# Patient Record
Sex: Male | Born: 2000 | Race: White | Hispanic: No
Health system: Northeastern US, Academic
[De-identification: ages and names within clinical notes are randomized; demographics above are authoritative.]

## PROBLEM LIST (undated history)

## (undated) DIAGNOSIS — R569 Unspecified convulsions: Secondary | ICD-10-CM

## (undated) DIAGNOSIS — L309 Dermatitis, unspecified: Secondary | ICD-10-CM

## (undated) DIAGNOSIS — Z889 Allergy status to unspecified drugs, medicaments and biological substances status: Secondary | ICD-10-CM

## (undated) HISTORY — PX: TYMPANOSTOMY TUBE PLACEMENT: SHX32

## (undated) HISTORY — PX: TONSILLECTOMY: SUR1361

## (undated) HISTORY — PX: ADENOIDECTOMY: SHX5191

## (undated) HISTORY — PX: MYRINGOTOMY: SUR874

## (undated) HISTORY — PX: CIRCUMCISION: SUR203

## (undated) HISTORY — PX: ADENOIDECTOMY: SUR15

---
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
## 2019-04-07 ENCOUNTER — Ambulatory Visit: Payer: Self-pay | Attending: Internal Medicine

## 2019-04-07 DIAGNOSIS — Z23 Encounter for immunization: Secondary | ICD-10-CM | POA: Insufficient documentation

## 2019-04-07 NOTE — Progress Notes (Signed)
   Covid-19 Vaccination Clinic  Name:  Peter Gordon    MRN: [id number] DOB: 2000-09-12  04/07/2019  Mr. Peter Gordon was observed post Covid-19 immunization for 15 minutes without incident. He was provided with Vaccine Information Sheet and instruction to access the V-Safe system.   Mr. Peter Gordon was instructed to call 911 with any severe reactions post vaccine: Marland Kitchen Difficulty breathing  . Swelling of face and throat  . A fast heartbeat  . A bad rash all over body  . Dizziness and weakness   Immunizations Administered    Name Date Dose VIS Date Route   Pfizer COVID-19 Vaccine 04/07/2019 11:59 AM 0.3 mL 01/12/2019 Intramuscular   Manufacturer: [employer], [employer]   Lot: [id number]   NDC: [address]-0

## 2019-04-28 ENCOUNTER — Ambulatory Visit: Payer: Self-pay | Attending: Internal Medicine

## 2019-04-28 DIAGNOSIS — Z23 Encounter for immunization: Secondary | ICD-10-CM

## 2019-04-28 NOTE — Progress Notes (Signed)
   Covid-19 Vaccination Clinic  Name:  Peter Gordon    MRN: [id number] DOB: [birth year]  04/28/2019  [name] was observed post Covid-19 immunization for 15 minutes without incident. He was provided with Vaccine Information Sheet and instruction to access the V-Safe system.   [name] was instructed to call 911 with any severe reactions post vaccine: Marland Kitchen Difficulty breathing  . Swelling of face and throat  . A fast heartbeat  . A bad rash all over body  . Dizziness and weakness   Immunizations Administered    Name Date Dose VIS Date Route   Pfizer COVID-19 Vaccine 04/28/2019 11:57 AM 0.3 mL 01/12/2019 Intramuscular   Manufacturer: [employer], [employer]   Lot: [id number]   NDC: [address]-8

## 2019-11-23 ENCOUNTER — Ambulatory Visit: Admitting: Sports Medicine

## 2019-11-23 ENCOUNTER — Ambulatory Visit: Admitting: Physician Assistant

## 2019-11-23 NOTE — Progress Notes (Signed)
* * Adam Foster **DOB:** 2000/04/26 (19 yo M) **Acc No.** [id number]  **DOS:** 11/23/2019    ---        Adam Foster    ------    71 Y old Male, DOB: 2000-04-29, External MRN: [id number]    Account Number: [id number]    [address], [place], [phone]    Home: [phone]    Insurance: [employer] PPO Payer ID: PAPER    PCP: Rhoderick Moody, MD Referring: Rhoderick Moody, MD External Visit ID:  [id number]    [facility]        * * *    11/23/2019    **Appointment Provider:** Aavya Shafer WHITE, PA **CHN#:** [phone]    ------      **Supervising Provider:** Rhoderick Moody    ---        **Current Medications**    ---    Taking      * FLUoxetine HCl 10 MG Capsule 1 capsule Orally Once a day     ---    Medication List reviewed and reconciled with the patient    ---      Past Medical History    ---      Anxiety/Depression.        ---      **Surgical History**    ---      No Surgical History documented.    ---      **Family History**    ---      : unknown    ---    Mother Has Hx of Stroke.    ---      **Social History**    ---    Tobacco  history: Never smoked.    Marital Status  Single.    Work/Occupation: full-time Consulting civil engineer.    Alcohol  Yes regularly.      **Hospitalization/Major Diagnostic Procedure**    ---      No Hospitalization History.    ---      **Review of Systems**    ---    _ORT_ :    Eyes  No  . Ear, Nose Throat  No  . Digestion, Stomach, Bowel  No  . Bladder  Problems  No  . Bleeding Problems  No  . Numbness/Tingling  No  .  Anxiety/Depression  No  . Fever/Chills/Fatigue  No  . Chest  Pain/Tightness/Palpitations  No  . Skin Rash  No  . Dental Problems  No  .  Joint/Muscle Pain/Cramps  Yes  . Blackout/Fainting  No  . Other  No  .            **Reason for Appointment**    ---      1\. Left knee pain DOI 11/19/2019    ---      **History of Present Illness**    ---    _COVID SCREEN_ :    19 year old male is being seen for the  first time. Presents with 4 days of  left knee pain after soccer injury DOI 11-19-19. Another player hit into him  and he fell directly onto the left knee. Plays for the [employer] soccer team. He  was able to continue playing the game and the next 2 practices. Reports  immediate swelling. Suffered an abrasion at the left anterior lateral aspect  of the knee. Pain is somewhat diffuse at the anterior knee. Feels a catching  sensation. Limping when he walks. He is taking Tylenol  and ibuprofen.  Elevating the knee and applying ice as needed.      **Vital Signs**    ---    Pain scale 6, Ht-in 72, Wt-lbs 193, BMI  **26.17** , BSA **2.11** , Ht-cm  **182.88** , Wt-kg **87.55** .      **Physical Examination**    ---    On examination Adam Foster is a pleasant-well appearing male in no apparent distress.  Gait is antalgic. Examination of the left knee reveals skin is clean and dry.  There is a large feeling abrasion at the anterior lateral aspect of the knee  joint. There is moderate effusion. There is no tenderness over the medial  greater than lateral joint line. PROM 5-90. AROM 0-120. Pain at the medial  aspect of the knee with full extension and deep flexion. Quad and patellar  tendon are nontender and without palpable gap. Able to straight leg raise  without extensor lag. Laxity with valgus stress at 30 degrees. There is severe  pain with valgus stress. Knee is stable to varus stress test. Stable  Lachman's. Negative anterior and posterior drawer. Calf is soft and nontender.  Fires EHL/FHL/GS/TA. Neurologic sensation intact to light touch in S/S/DP/SP/T  nerve distributions. Lower extremities are warm and well perfused with  palpable DP and PT pulses.    Radiographs: X-rays of the left knee were obtained and reviewed by [place] today.  No acute fracture or dislocation.      **Assessments**    ---    1\. Acute pain of left knee - M25.562 (Primary)    ---    2\. Tear of medial collateral ligament of left knee, initial encounter  -  Q00.867Y    ---      **Treatment**    ---      **1\. Acute pain of left knee**    Notes: Patient was seen and evaluated with Dr. Leandro Reasoner. All findings were  reviewed with the patient today including x-rays. Patient suffered a knee  injury playing soccer 4 days ago. He has an antalgic gait with a moderate  effusion, and medial greater than lateral joint line pain. He has laxity with  valgus stress consistent with MCL tear. Given his examination in combination  with the mechanical symptoms we would like to obtain an MRI to further  evaluate. Would like to rule out an unstable meniscus tear or other  ligamentous injury. he was provided with a Bledsoe brace today to wear  unlocked. He should wear the brace overnight. He may progress range of motion  as tolerated. Follow-up with the MRI results. Answered all questions and  patient agrees with plan.    ---      **Follow Up**    ---    after MRI    **Appointment Provider:** Osa Craver, PA    Electronically signed by Chauncey Cruel , MD on 11/26/2019 at 01:29 PM EDT    Sign off status: Completed        * * *        [facility]    [address]    [place], [place] [address]    Tel: [phone]    Fax: [phone]              * * *          Progress Note: Adam Rohman WHITE, PA 11/23/2019    ---    Note generated by eClinicalWorks EMR/PM Software ([email or web address])

## 2019-11-23 NOTE — Progress Notes (Signed)
 .  Progress Notes  .  Patient: Adam Foster  Provider: Osa Craver    .DOB: 10/21/2000 Age: 19 Y Sex: Male  Supervising Provider:: Rhoderick Moody  Date: 11/23/2019  .  PCP: Rhoderick Moody MD  Date: 11/23/2019  .  --------------------------------------------------------------------------------  .  REASON FOR APPOINTMENT  .  1. Left knee pain DOI 11/19/2019  .  HISTORY OF PRESENT ILLNESS  .  COVID SCREEN:   19 year old male is being seen for the first time. Presents with  4 days of left knee pain after soccer injury DOI 11-19-19.  Another player hit into him and he fell directly onto the left  knee. Plays for the [employer] soccer team. He was able to continue  playing the game and the next 2 practices. Reports immediate  swelling. Suffered an abrasion at the left anterior lateral  aspect of the knee. Pain is somewhat diffuse at the anterior  knee. Feels a catching sensation. Limping when he walks. He is  taking Tylenol and ibuprofen. Elevating the knee and applying ice  as needed.  .  CURRENT MEDICATIONS  .  Taking FLUoxetine HCl 10 MG Capsule 1 capsule Orally Once a day  Medication List reviewed and reconciled with the patient  .  PAST MEDICAL HISTORY  .  Anxiety/Depression  .  ALLERGIES  .  yes[Allergies Verified]  .  SURGICAL HISTORY  .  No Surgical History documented.  Marland Kitchen  FAMILY HISTORY  .  : unknown  Mother Has Hx of Stroke.  .  SOCIAL HISTORY  .  .  Tobaccohistory:Never smoked.  .  Marital Status Single.  .  Work/Occupation: full-time Consulting civil engineer.  .  Alcohol Yes regularly.  Marland Kitchen  HOSPITALIZATION/MAJOR DIAGNOSTIC PROCEDURE  .  No Hospitalization History.  Marland Kitchen  REVIEW OF SYSTEMS  .  ORT:  .  Eyes    No . Ear, Nose Throat    No . Digestion, Stomach, Bowel     No . Bladder Problems    No . Bleeding Problems    No .  Numbness/Tingling    No . Anxiety/Depression    No .  Fever/Chills/Fatigue    No . Chest Pain/Tightness/Palpitations     No . Skin Rash    No . Dental Problems    No . Joint/Muscle  Pain/Cramps     Yes . Blackout/Fainting    No . Other    No .  .  VITAL SIGNS  .  Pain scale 6, Ht-in 72, Wt-lbs 193, BMI 26.17, BSA 2.11, Ht-cm  182.88, Wt-kg 87.55.  Marland Kitchen  PHYSICAL EXAMINATION  .  On examination [name] is a pleasant-well appearing male in no  apparent distress. Gait is antalgic. Examination of the left knee  reveals skin is clean and dry. There is a large feeling abrasion  at the anterior lateral aspect of the knee joint. There is  moderate effusion. There is no tenderness over the medial greater  than lateral joint line. PROM 5-90. AROM 0-120. Pain at the  medial aspect of the knee with full extension and deep flexion.  Quad and patellar tendon are nontender and without palpable gap.  Able to straight leg raise without extensor lag. Laxity with  valgus stress at 30 degrees. There is severe pain with valgus  stress. Knee is stable to varus stress test. Stable Lachman's.  Negative anterior and posterior drawer. Calf is soft and  nontender. Fires EHL/FHL/GS/TA. Neurologic sensation intact to  light touch in S/S/DP/SP/T nerve distributions. Lower  extremities  are warm and well perfused with palpable DP and PT pulses.  Radiographs: X-rays of the left knee were obtained and reviewed  by [place] today. No acute fracture or dislocation.  .  ASSESSMENTS  .  Acute pain of left knee - M25.562 (Primary)  .  Tear of medial collateral ligament of left knee, initial  encounter - S92.909M  .  TREATMENT  .  Acute pain of left knee  Notes: Patient was seen and evaluated with Dr. Leandro Reasoner. All  findings were reviewed with the patient today including x-rays.  Patient suffered a knee injury playing soccer 4 days ago. He has  an antalgic gait with a moderate effusion, and medial greater  than lateral joint line pain. He has laxity with valgus stress  consistent with MCL tear. Given his examination in combination  with the mechanical symptoms we would like to obtain an MRI to  further evaluate. Would like to rule out an unstable  meniscus  tear or other ligamentous injury. he was provided with a Bledsoe  brace today to wear unlocked. He should wear the brace overnight.  He may progress range of motion as tolerated. Follow-up with the  MRI results. Answered all questions and patient agrees with plan.  .  FOLLOW UP  .  after MRI  .  Marland Kitchen  Appointment Provider: Osa Craver, PA  .  Electronically signed by Chauncey Cruel , MD on  11/26/2019 at 01:29 PM EDT  .  CONFIRMATORY SIGN OFF  SALZLER,MATTHEW J 11/26/2019 1:28:01 PM > I personally interviewed and examined the patient and both the PA and I contributed to this electronic note. I agree with the history, exam, assessment and plan as detailed in this note and edited it as necessary.  .  Document electronically signed by WHITE, COURTNEY    .

## 2019-11-25 ENCOUNTER — Ambulatory Visit: Admitting: Sports Medicine

## 2019-11-27 ENCOUNTER — Ambulatory Visit: Admit: 2019-11-27 | Payer: 59

## 2019-11-27 ENCOUNTER — Ambulatory Visit: Admitting: Sports Medicine

## 2019-11-27 ENCOUNTER — Ambulatory Visit

## 2019-11-27 ENCOUNTER — Ambulatory Visit (HOSPITAL_BASED_OUTPATIENT_CLINIC_OR_DEPARTMENT_OTHER): Admitting: Psychiatry

## 2019-11-27 NOTE — Progress Notes (Signed)
* * Adam Foster **DOB:** 23-Jan-2001 (19 yo M) **Acc No.** [id number]  **DOS:** 11/27/2019    ---        Adam Foster    ------    19 Y old Male, DOB: 15-Mar-2000, External MRN: [id number]    Account Number: [id number]    [address], [place], [phone]    Home: [phone]    Insurance: [employer] PPO Payer ID: PAPER    PCP: Rhoderick Moody, MD Referring: Rhoderick Moody, MD External Visit ID:  [id number]    [facility]        * * *    11/27/2019    **Appointment Provider:** Amalia Greenhouse, MD **CHN#:** [id number]    ------      **Supervising Provider:** Rhoderick Moody    ---        **Current Medications**    ---    Taking      * FLUoxetine HCl 10 MG Capsule 1 capsule Orally Once a day     ---    Medication List reviewed and reconciled with the patient    ---      Past Medical History    ---      Anxiety/Depression.        ---      **Surgical History**    ---      No Surgical History documented.    ---      **Family History**    ---      : unknown    ---    Mother Has Hx of Stroke.    ---      **Social History**    ---    Tobacco  history: Never smoked.    Marital Status  Single.    Work/Occupation: full-time Consulting civil engineer.    Alcohol  Yes regularly.      **Allergies**    ---      N.K.D.A.    ---    Adam Foster Verified]      **Hospitalization/Major Diagnostic Procedure**    ---      No Hospitalization History.    ---      **Review of Systems**    ---    _ORT_ :    Eyes  No  . Ear, Nose Throat  No  . Digestion, Stomach, Bowel  No  . Bladder  Problems  No  . Bleeding Problems  No  . Numbness/Tingling  No  .  Anxiety/Depression  No  . Fever/Chills/Fatigue  No  . Chest  Pain/Tightness/Palpitations  No  . Skin Rash  No  . Dental Problems  No  .  Joint/Muscle Pain/Cramps  Yes  . Blackout/Fainting  No  . Other  No  .            **Reason for Appointment**    ---      1\. Left knee MCL tear    ---      **History of Present Illness**    ---     _GENERAL_ :    Adam Foster is a 19 yo M who sustained a left knee injury 1 week ago. He was  seen in our office afterwards at which time exam was concerning for a MCL  tear. He had a MRI performed and presents for follow up.    Today he reports minimal pain. He has been ambulating in a brace without  difficulty. He feels as though he could perform cutting and pivoting  motions  without instability.      **Vital Signs**    ---    Pain scale 3, Ht-in 72, Ht-cm 182.88.      **Physical Examination**    ---    Exam of the left knee demonstrates no knee effusion. He has tenderness over  the femoral origin of the MCL. No MJL/LJL tenderness. Knee is stable to  Lachman, anterior/posterior drawer, and varus stress. Knee opens slightly more  to valgus stress than opposite side. Neurovascularly intact distally.    Imaging: MRI of the right knee was reviewed. ACL/PCL are intact with no  evidence of meniscal injury. He does however have a partial femoral sided tear  of his MCL, with continuity of the fibers to the tibia.      **Assessments**    ---    1\. Tear of MCL (medial collateral ligament) of knee - S83.429A (Primary)    ---      **Treatment**    ---      **1\. Tear of MCL (medial collateral ligament) of knee**    Notes: Adam Foster is a 19 yo M here for follow up of a soccer injury sustained  1 week ago. Clinical exam and MRI findings are consistent with a stable  femoral sided MCL tear but the fibers are in continuity intact to the tibia.  We had a lengthy discussion with Adam Foster about this injury. 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 We assessed Adam Foster ability  to perform lateral movement in the office today and he tolerated this well  without pain or instability. We are ok with Adam Foster playing if he passess  cutting, pivoting, twisting tests with his Athletic Trainer and he wears a  short hinged knee brace. He understands. He will follow up with [place] after the  season if he continues to  have pain. All questions answered.    ---      **Follow Up**    ---    prn    **Appointment Provider:** Amalia Greenhouse, MD    Electronically signed by Chauncey Cruel , MD on 12/06/2019 at 11:08 AM EDT    Sign off status: Completed        * * *        [facility]    [address]    [place], [place] [address]    Tel: [phone]    Fax: [phone]              * * *          Progress Note: Amalia Greenhouse, MD 11/27/2019    ---    Note generated by eClinicalWorks EMR/PM Software ([email or web address])

## 2019-11-27 NOTE — Progress Notes (Signed)
 .  Progress Notes  .  Patient: Adam Foster  Provider: Amalia Greenhouse    .DOB: 11-29-2000 Age: 19 Y Sex: Male  Supervising Provider:: Rhoderick Moody  Date: 11/27/2019  .  PCP: Rhoderick Moody MD  Date: 11/27/2019  .  --------------------------------------------------------------------------------  .  REASON FOR APPOINTMENT  .  1. Left knee MCL tear  .  HISTORY OF PRESENT ILLNESS  .  GENERAL:   Heinz is a 19 yo M who sustained a left knee injury 1 week  ago. He was seen in our office afterwards at which time exam was  concerning for a MCL tear. He had a MRI performed and presents  for follow up. Today he reports minimal pain. He has been  ambulating in a brace without difficulty. He feels as though he  could perform cutting and pivoting motions without instability.  .  CURRENT MEDICATIONS  .  Taking FLUoxetine HCl 10 MG Capsule 1 capsule Orally Once a day  Medication List reviewed and reconciled with the patient  .  PAST MEDICAL HISTORY  .  Anxiety/Depression  .  ALLERGIES  .  N.K.D.A.  .  SURGICAL HISTORY  .  No Surgical History documented.  Marland Kitchen  FAMILY HISTORY  .  : unknown  Mother Has Hx of Stroke.  .  SOCIAL HISTORY  .  .  Tobaccohistory:Never smoked.  .  Marital Status Single.  .  Work/Occupation: full-time Consulting civil engineer.  .  Alcohol Yes regularly.  Marland Kitchen  HOSPITALIZATION/MAJOR DIAGNOSTIC PROCEDURE  .  No Hospitalization History.  Marland Kitchen  REVIEW OF SYSTEMS  .  ORT:  .  Eyes    No . Ear, Nose Throat    No . Digestion, Stomach, Bowel     No . Bladder Problems    No . Bleeding Problems    No .  Numbness/Tingling    No . Anxiety/Depression    No .  Fever/Chills/Fatigue    No . Chest Pain/Tightness/Palpitations     No . Skin Rash    No . Dental Problems    No . Joint/Muscle  Pain/Cramps    Yes . Blackout/Fainting    No . Other    No .  .  VITAL SIGNS  .  Pain scale 3, Ht-in 72, Ht-cm 182.88.  Marland Kitchen  PHYSICAL EXAMINATION  .  Exam of the left knee demonstrates no knee effusion. He has  tenderness over the femoral  origin of the MCL. No MJL/LJL  tenderness. Knee is stable to Lachman, anterior/posterior drawer,  and varus stress. Knee opens slightly more to valgus stress than  opposite side. Neurovascularly intact distally. Imaging: MRI of  the right knee was reviewed. ACL/PCL are intact with no evidence  of meniscal injury. He does however have a partial femoral sided  tear of his MCL, with continuity of the fibers to the tibia.  .  ASSESSMENTS  .  Tear of MCL (medial collateral ligament) of knee - A76.811X  (Primary)  .  TREATMENT  .  Tear of MCL (medial collateral ligament) of knee  Notes: Vahe is a 19 yo M here for follow up of a soccer  injury sustained 1 week ago. Clinical exam and MRI findings are  consistent with a stable femoral sided MCL tear but the fibers  are in continuity intact to the tibia. We had a lengthy  discussion with Rufino about this injury. 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 We assessed  [name]  ability to perform lateral movement in the office today  and he tolerated this well without pain or instability. We are ok  with [name] playing if he passess cutting, pivoting, twisting  tests with his Athletic Trainer and he wears a short hinged knee  brace. He understands. He will follow up with [place] after the season  if he continues to have pain. All questions answered.  .  FOLLOW UP  .  prn  .  Marland Kitchen  Appointment Provider: Amalia Greenhouse, MD  .  Electronically signed by Chauncey Cruel , MD on  12/06/2019 at 11:08 AM EDT  .  CONFIRMATORY SIGN OFF  SALZLER,MATTHEW J 12/06/2019 11:04:13 AM > , I personally interviewed and examined the patient and both the resident and I contributed to this electronic note. I agree with the history, exam, assessment and plan as detailed in this note and edited it as necessary.  .  Document electronically signed by Amalia Greenhouse    .

## 2024-02-13 ENCOUNTER — Encounter: Payer: Self-pay | Admitting: Family Medicine

## 2024-02-13 ENCOUNTER — Ambulatory Visit: Payer: Self-pay

## 2024-02-13 ENCOUNTER — Ambulatory Visit: Payer: Self-pay | Admitting: Family Medicine

## 2024-02-13 VITALS — BP 124/86 | Ht 71.0 in | Wt 220.0 lb

## 2024-02-13 DIAGNOSIS — M76892 Other specified enthesopathies of left lower limb, excluding foot: Secondary | ICD-10-CM

## 2024-02-13 DIAGNOSIS — M79652 Pain in left thigh: Secondary | ICD-10-CM

## 2024-02-13 NOTE — Progress Notes (Signed)
 PCP: Lesleigh Klinefelter, MD  Subjective:   HPI: Patient is a 24 y.o. male here for left knee weakness and pain. Patient reports has been having weakness in his left leg since October/novermber. He plays in a recreational soccer league in [place] and mentions that he tripped while warming up while on his left leg. He continued to play. Since then patient has had some weakness in his left leg. At the gym was unable to do knee extensions even without any weight on the machine and feels his leg shaking sometimes.   Has had previous hamstring and quadriceps injuries.   Past Medical History:  Diagnosis Date   Eczema    Multiple allergies    Seizures (HCC)     Medications Ordered Prior to Encounter[1]  Past Surgical History:  Procedure Laterality Date   ADENOIDECTOMY     ADENOIDECTOMY     CIRCUMCISION     MYRINGOTOMY     TONSILLECTOMY     TYMPANOSTOMY TUBE PLACEMENT      Allergies[2]  BP 124/86   Ht 5' 11 (1.803 m)   Wt 220 lb (99.8 kg)   BMI 30.68 kg/m       No data to display              No data to display              Objective:  Physical Exam:  Gen: NAD, comfortable in exam room  No gross deformity, ecchymoses, swelling. No TTP of medial or lateral joint lines. TTP along quadriceps tendon on left side.  FROM with decreased hip, quadriceps, hamstring strength on the left. Difficulty with resisted knee extension testing and pain.  Negative ant/post drawers. Negative valgus/varus testing. Negative lachman.  Negative mcmurrays.  NV intact distally.  [facility] Limited knee ultrasound performed Date: 02/13/2024 Indication: left knee pain and weakness  Findings: Suprapatellar pouch visualized in long and short axis with no abnormality. Quadriceps tendon visualized in both long and short axis with calcification at the proximal end with tenderness to palpation.  Patellar Tendon is visualized in long and short axis without abnormality. Medial meniscus and MCL  visualized with minimal scarring of MCL.  Lateral meniscus and LCL visualized with no abnormality.   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 Likely complicated and causing relative weakness of the left quadriceps.  - Physical therapy referral  - Can consider extracorporeal shock wave therapy or nitrous oxide in the future if symptoms continue. Limited by patient living in [place] and visiting family at this time.  - Follow up if symptoms continue.        [1]  Current Outpatient Medications on File Prior to Visit  Medication Sig Dispense Refill   desonide (DESOWEN) 0.05 % cream Apply topically 2 (two) times daily.     EPINEPHrine  (AUVI-Q ) 0.3 mg/0.3 mL IJ SOAJ injection Use as directed for severe allergic reaction. 2 Device 1   EPINEPHrine  (EPIPEN  2-PAK) 0.3 mg/0.3 mL IJ SOAJ injection Inject 0.3 mg into the muscle once.     fexofenadine (ALLEGRA ALLERGY) 180 MG tablet Take 180 mg by mouth daily.       fluticasone  (FLONASE ) 50 MCG/ACT nasal spray 2 sprays per nostril  daily as needed for stuffy nose. 16 g 5   Methylphenidate (COTEMPLA XR-ODT) 17.3 MG TBED Take by mouth.     mometasone (ELOCON) 0.1 % cream Apply 1 application topically daily.     multivitamin (BARIATRIC VIT W/EXTRA C) CHEW Chew 2 tablets by mouth daily.       triamcinolone cream (KENALOG) 0.1 % Apply 1 application topically 2 (two) times daily.     No current facility-administered medications on file prior to visit.  [2] No Known Allergies
